# Patient Record
Sex: Female | Born: 1950
Health system: Southern US, Community
[De-identification: ages and names within clinical notes are randomized; demographics above are authoritative.]

## PROBLEM LIST (undated history)

## (undated) DIAGNOSIS — C801 Malignant (primary) neoplasm, unspecified: Secondary | ICD-10-CM

## (undated) DIAGNOSIS — E785 Hyperlipidemia, unspecified: Secondary | ICD-10-CM

## (undated) DIAGNOSIS — M1990 Unspecified osteoarthritis, unspecified site: Secondary | ICD-10-CM

## (undated) DIAGNOSIS — T7840XA Allergy, unspecified, initial encounter: Secondary | ICD-10-CM

## (undated) DIAGNOSIS — M8580 Other specified disorders of bone density and structure, unspecified site: Secondary | ICD-10-CM

## (undated) HISTORY — DX: Other specified disorders of bone density and structure, unspecified site: M85.80

## (undated) HISTORY — DX: Unspecified osteoarthritis, unspecified site: M19.90

## (undated) HISTORY — PX: GASTROSTOMY: SHX151

## (undated) HISTORY — DX: Hyperlipidemia, unspecified: E78.5

## (undated) HISTORY — DX: Allergy, unspecified, initial encounter: T78.40XA

## (undated) HISTORY — DX: Malignant (primary) neoplasm, unspecified: C80.1

---

## 1998-03-30 HISTORY — PX: BREAST BIOPSY: SHX20

## 2003-03-23 HISTORY — PX: MELANOMA EXCISION: SHX5266

## 2004-07-22 ENCOUNTER — Other Ambulatory Visit: Admission: RE | Admit: 2004-07-22 | Discharge: 2004-07-22 | Payer: Self-pay | Admitting: *Deleted

## 2005-01-02 HISTORY — PX: COLONOSCOPY: SHX174

## 2010-07-09 HISTORY — PX: OOPHORECTOMY: SHX86

## 2013-12-14 HISTORY — PX: ABDOMINAL HYSTERECTOMY: SHX81

## 2014-11-17 HISTORY — PX: MOHS SURGERY: SUR867

## 2015-09-15 ENCOUNTER — Other Ambulatory Visit: Payer: Self-pay | Admitting: Family Medicine

## 2015-09-15 DIAGNOSIS — Z1211 Encounter for screening for malignant neoplasm of colon: Secondary | ICD-10-CM

## 2015-09-30 ENCOUNTER — Encounter: Payer: Self-pay | Admitting: Gastroenterology

## 2015-11-08 ENCOUNTER — Ambulatory Visit: Payer: Self-pay

## 2015-11-29 ENCOUNTER — Ambulatory Visit (AMBULATORY_SURGERY_CENTER): Payer: Self-pay | Admitting: *Deleted

## 2015-11-29 VITALS — Ht 63.0 in | Wt 145.0 lb

## 2015-11-29 DIAGNOSIS — Z1211 Encounter for screening for malignant neoplasm of colon: Secondary | ICD-10-CM

## 2015-11-29 MED ORDER — NA SULFATE-K SULFATE-MG SULF 17.5-3.13-1.6 GM/177ML PO SOLN: 1.0000 | Freq: Once | ORAL | 0 refills | Status: AC

## 2015-11-29 NOTE — Progress Notes (Signed)
No egg or soy allergy known to patient  No issues with past sedation with any surgeries  or procedures, no intubation problems  No diet pills per patient No home 02 use per patient  No blood thinners per patient  Pt denies issues with constipation  No A fib or A flutter   

## 2015-12-13 ENCOUNTER — Ambulatory Visit (AMBULATORY_SURGERY_CENTER): Payer: Commercial Managed Care - PPO | Admitting: Gastroenterology

## 2015-12-13 ENCOUNTER — Encounter: Payer: Self-pay | Admitting: Gastroenterology

## 2015-12-13 VITALS — BP 134/71 | HR 78 | Temp 97.7°F | Resp 18 | Ht 63.0 in | Wt 145.0 lb

## 2015-12-13 DIAGNOSIS — D123 Benign neoplasm of transverse colon: Secondary | ICD-10-CM | POA: Diagnosis not present

## 2015-12-13 DIAGNOSIS — Z1212 Encounter for screening for malignant neoplasm of rectum: Secondary | ICD-10-CM | POA: Diagnosis not present

## 2015-12-13 DIAGNOSIS — Z1211 Encounter for screening for malignant neoplasm of colon: Secondary | ICD-10-CM

## 2015-12-13 MED ORDER — SODIUM CHLORIDE 0.9 % IV SOLN: 500.0000 mL | INTRAVENOUS | Status: DC

## 2015-12-13 NOTE — Op Note (Signed)
[place] Patient Name: Brianna Cruz Procedure Date: 12/13/2015 9:01 AM MRN: [id number] Endoscopist: Remo Lipps P. Geary Rufo MD, MD Age: 65 Referring MD:  Date of Birth: 1950-10-25 Gender: Female Account #: [id number] Procedure:                Colonoscopy Indications:              Screening for colorectal malignant neoplasm Medicines:                Monitored Anesthesia Care Procedure:                Pre-Anesthesia Assessment:                           - Prior to the procedure, a History and Physical                            was performed, and patient medications and                            allergies were reviewed. The patient's tolerance of                            previous anesthesia was also reviewed. The risks                            and benefits of the procedure and the sedation                            options and risks were discussed with the patient.                            All questions were answered, and informed consent                            was obtained. Prior Anticoagulants: The patient has                            taken Coumadin (warfarin), last dose was 1 day                            prior to procedure. ASA Grade Assessment: I - A                            normal, healthy patient. After reviewing the risks                            and benefits, the patient was deemed in                            satisfactory condition to undergo the procedure.                           After obtaining informed consent, the colonoscope  was passed under direct vision. Throughout the                            procedure, the patient's blood pressure, pulse, and                            oxygen saturations were monitored continuously. The                            Model PCF-H190DL [id number]) scope was introduced                            through the anus and advanced to the the cecum,                            identified by  appendiceal orifice and ileocecal                            valve. The colonoscopy was performed without                            difficulty. The patient tolerated the procedure                            well. The quality of the bowel preparation was                            good. The ileocecal valve, appendiceal orifice, and                            rectum were photographed. Scope In: 9:11:14 AM Scope Out: 9:28:44 AM Scope Withdrawal Time: 0 hours 14 minutes 54 seconds  Total Procedure Duration: 0 hours 17 minutes 30 seconds  Findings:                 The perianal and digital rectal examinations were                            normal.                           A 5 mm polyp was found in the transverse colon. The                            polyp was sessile. The polyp was removed with a                            cold snare. Resection and retrieval were complete.                           Scattered medium-mouthed diverticula were found in                            the left colon and right colon.  The exam was otherwise without abnormality. Complications:            No immediate complications. Estimated blood loss:                            Minimal. Estimated Blood Loss:     Estimated blood loss was minimal. Impression:               - One 5 mm polyp in the transverse colon, removed                            with a cold snare. Resected and retrieved.                           - Diverticulosis in the left colon and in the right                            colon.                           - The examination was otherwise normal. Recommendation:           - Patient has a contact number available for                            emergencies. The signs and symptoms of potential                            delayed complications were discussed with the                            patient. Return to normal activities tomorrow.                            Written  discharge instructions were provided to the                            patient.                           - Resume previous diet.                           - Continue present medications.                           - No ibuprofen, naproxen, or other non-steroidal                            anti-inflammatory drugs for 2 weeks after polyp                            removal.                           - Await pathology results.                           -  Repeat colonoscopy is recommended for                            surveillance. The colonoscopy date will be                            determined after pathology results from today's                            exam become available for review. Remo Lipps P. [name] MD, MD 12/13/2015 9:32:00 AM This report has been signed electronically.

## 2015-12-13 NOTE — Patient Instructions (Addendum)
NO NSAIDS (MOTRIN, ADVIL, IBUOROFEN, ALEVE, NAPROSYN ETC) FOR TWO WEEKS UNTIL 12/27/15.    YOU HAD AN ENDOSCOPIC PROCEDURE TODAY AT [facility]:   Refer to the procedure report that was given to you for any specific questions about what was found during the examination.  If the procedure report does not answer your questions, please call your gastroenterologist to clarify.  If you requested that your care partner not be given the details of your procedure findings, then the procedure report has been included in a sealed envelope for you to review at your convenience later.  YOU SHOULD EXPECT: Some feelings of bloating in the abdomen. Passage of more gas than usual.  Walking can help get rid of the air that was put into your GI tract during the procedure and reduce the bloating. If you had a lower endoscopy (such as a colonoscopy or flexible sigmoidoscopy) you may notice spotting of blood in your stool or on the toilet paper. If you underwent a bowel prep for your procedure, you may not have a normal bowel movement for a few days.  Please Note:  You might notice some irritation and congestion in your nose or some drainage.  This is from the oxygen used during your procedure.  There is no need for concern and it should clear up in a day or so.  SYMPTOMS TO REPORT IMMEDIATELY:   Following lower endoscopy (colonoscopy or flexible sigmoidoscopy):  Excessive amounts of blood in the stool  Significant tenderness or worsening of abdominal pains  Swelling of the abdomen that is new, acute  Fever of 100F or higher  For urgent or emergent issues, a gastroenterologist can be reached at any hour by calling [phone].   DIET:  We do recommend a small meal at first, but then you may proceed to your regular diet.  Drink plenty of fluids but you should avoid alcoholic beverages for 24 hours.  ACTIVITY:  You should plan to take it easy for the rest of today and you should NOT DRIVE or  use heavy machinery until tomorrow (because of the sedation medicines used during the test).    FOLLOW UP: Our staff will call the number listed on your records the next business day following your procedure to check on you and address any questions or concerns that you may have regarding the information given to you following your procedure. If we do not reach you, we will leave a message.  However, if you are feeling well and you are not experiencing any problems, there is no need to return our call.  We will assume that you have returned to your regular daily activities without incident.  If any biopsies were taken you will be contacted by phone or by letter within the next 1-3 weeks.  Please call us at [phone] if you have not heard about the biopsies in 3 weeks.    SIGNATURES/CONFIDENTIALITY: You and/or your care partner have signed paperwork which will be entered into your electronic medical record.  These signatures attest to the fact that that the information above on your After Visit Summary has been reviewed and is understood.  Full responsibility of the confidentiality of this discharge information lies with you and/or your care-partner.

## 2015-12-13 NOTE — Progress Notes (Signed)
PATIENT COMPLAINED OF LOWER ABDOMINAL DISCOMFORT, 4/10. PATIENT LAYED DOWN AND PASSED GAS, 1/10. PATIENT SEEN AGAIN BY DR. Wilma Flavin, OKED FOR DISCHARGE.

## 2015-12-13 NOTE — Progress Notes (Signed)
Report given to PACU RN, vss 

## 2015-12-13 NOTE — Progress Notes (Signed)
Called to room to assist during endoscopic procedure.  Patient ID and intended procedure confirmed with present staff. Received instructions for my participation in the procedure from the performing physician.  

## 2015-12-14 ENCOUNTER — Telehealth: Payer: Self-pay

## 2015-12-14 NOTE — Telephone Encounter (Signed)
  Follow up Call-  Call back number 12/13/2015  Post procedure Call Back phone  # [phone]  Permission to leave phone message Yes  Some recent data might be hidden     Patient questions:  Do you have a fever, pain , or abdominal swelling? No. Pain Score  0 *  Have you tolerated food without any problems? Yes.    Have you been able to return to your normal activities? Yes.    Do you have any questions about your discharge instructions: Diet   No. Medications  No. Follow up visit  No.  Do you have questions or concerns about your Care? No.  Actions: * If pain score is 4 or above: No action needed, pain <4.

## 2015-12-14 NOTE — Telephone Encounter (Signed)
  Follow up Call-  Call back number 12/13/2015  Post procedure Call Back phone  # [phone]  Permission to leave phone message Yes  Some recent data might be hidden     Patient was called for follow up after her procedure on 12/13/2015. No answer at the number given for follow up phone call. A message was left on the answering machine.

## 2015-12-17 ENCOUNTER — Telehealth: Payer: Self-pay | Admitting: Gastroenterology

## 2015-12-17 NOTE — Telephone Encounter (Signed)
Patient normally takes an 81 mg tablet of aspirin daily. She wondered if this was okay to continue as her discharge instructions said to stay away from NSAIDs for two weeks. She states she has no specific reason for taking the aspirin, has not noticed any blood in her stool. Told her that it was okay to continue if she wanted to but to avoid ibuprofen, goody powders, aleve etc. for the next two weeks. She will call office if she has questions or concerns.

## 2015-12-20 ENCOUNTER — Encounter: Payer: Self-pay | Admitting: Gastroenterology

## 2017-08-22 DIAGNOSIS — M722 Plantar fascial fibromatosis: Secondary | ICD-10-CM | POA: Diagnosis not present

## 2017-08-22 DIAGNOSIS — M7662 Achilles tendinitis, left leg: Secondary | ICD-10-CM | POA: Diagnosis not present

## 2017-08-22 DIAGNOSIS — M79671 Pain in right foot: Secondary | ICD-10-CM | POA: Diagnosis not present

## 2017-08-22 DIAGNOSIS — M79672 Pain in left foot: Secondary | ICD-10-CM | POA: Diagnosis not present

## 2017-09-04 DIAGNOSIS — Q667 Congenital pes cavus: Secondary | ICD-10-CM | POA: Diagnosis not present

## 2017-09-04 DIAGNOSIS — M79672 Pain in left foot: Secondary | ICD-10-CM | POA: Diagnosis not present

## 2017-09-04 DIAGNOSIS — M79671 Pain in right foot: Secondary | ICD-10-CM | POA: Diagnosis not present

## 2017-09-04 DIAGNOSIS — M722 Plantar fascial fibromatosis: Secondary | ICD-10-CM | POA: Diagnosis not present

## 2017-10-04 DIAGNOSIS — M722 Plantar fascial fibromatosis: Secondary | ICD-10-CM | POA: Diagnosis not present

## 2017-10-04 DIAGNOSIS — M79672 Pain in left foot: Secondary | ICD-10-CM | POA: Diagnosis not present

## 2017-10-04 DIAGNOSIS — M79671 Pain in right foot: Secondary | ICD-10-CM | POA: Diagnosis not present

## 2017-10-25 DIAGNOSIS — R69 Illness, unspecified: Secondary | ICD-10-CM | POA: Diagnosis not present

## 2017-11-03 DIAGNOSIS — R69 Illness, unspecified: Secondary | ICD-10-CM | POA: Diagnosis not present

## 2017-11-23 DIAGNOSIS — J209 Acute bronchitis, unspecified: Secondary | ICD-10-CM | POA: Diagnosis not present

## 2017-11-26 DIAGNOSIS — Z85820 Personal history of malignant melanoma of skin: Secondary | ICD-10-CM | POA: Diagnosis not present

## 2017-11-26 DIAGNOSIS — Z85828 Personal history of other malignant neoplasm of skin: Secondary | ICD-10-CM | POA: Diagnosis not present

## 2017-11-26 DIAGNOSIS — L821 Other seborrheic keratosis: Secondary | ICD-10-CM | POA: Diagnosis not present

## 2017-11-26 DIAGNOSIS — Z09 Encounter for follow-up examination after completed treatment for conditions other than malignant neoplasm: Secondary | ICD-10-CM | POA: Diagnosis not present

## 2017-11-26 DIAGNOSIS — D485 Neoplasm of uncertain behavior of skin: Secondary | ICD-10-CM | POA: Diagnosis not present

## 2017-11-26 DIAGNOSIS — L738 Other specified follicular disorders: Secondary | ICD-10-CM | POA: Diagnosis not present

## 2017-11-26 DIAGNOSIS — D225 Melanocytic nevi of trunk: Secondary | ICD-10-CM | POA: Diagnosis not present

## 2017-11-26 DIAGNOSIS — Z872 Personal history of diseases of the skin and subcutaneous tissue: Secondary | ICD-10-CM | POA: Diagnosis not present

## 2017-11-26 DIAGNOSIS — Z08 Encounter for follow-up examination after completed treatment for malignant neoplasm: Secondary | ICD-10-CM | POA: Diagnosis not present

## 2017-11-26 DIAGNOSIS — D2271 Melanocytic nevi of right lower limb, including hip: Secondary | ICD-10-CM | POA: Diagnosis not present

## 2017-12-06 DIAGNOSIS — R69 Illness, unspecified: Secondary | ICD-10-CM | POA: Diagnosis not present

## 2018-02-08 DIAGNOSIS — R5383 Other fatigue: Secondary | ICD-10-CM | POA: Diagnosis not present

## 2018-02-08 DIAGNOSIS — Z136 Encounter for screening for cardiovascular disorders: Secondary | ICD-10-CM | POA: Diagnosis not present

## 2018-02-08 DIAGNOSIS — R350 Frequency of micturition: Secondary | ICD-10-CM | POA: Diagnosis not present

## 2018-02-08 DIAGNOSIS — Z0000 Encounter for general adult medical examination without abnormal findings: Secondary | ICD-10-CM | POA: Diagnosis not present

## 2018-02-19 ENCOUNTER — Other Ambulatory Visit: Payer: Self-pay | Admitting: Physician Assistant

## 2018-02-19 DIAGNOSIS — Z1231 Encounter for screening mammogram for malignant neoplasm of breast: Secondary | ICD-10-CM

## 2018-03-15 ENCOUNTER — Other Ambulatory Visit: Payer: Self-pay

## 2018-03-15 ENCOUNTER — Ambulatory Visit
Admission: RE | Admit: 2018-03-15 | Discharge: 2018-03-15 | Disposition: A | Discharge status: Home / Self Care | Payer: Medicare HMO | Source: Ambulatory Visit | Attending: Physician Assistant | Admitting: Physician Assistant

## 2018-03-15 DIAGNOSIS — Z1231 Encounter for screening mammogram for malignant neoplasm of breast: Secondary | ICD-10-CM | POA: Diagnosis not present

## 2018-05-24 DIAGNOSIS — H5203 Hypermetropia, bilateral: Secondary | ICD-10-CM | POA: Diagnosis not present

## 2018-07-15 DIAGNOSIS — Z0100 Encounter for examination of eyes and vision without abnormal findings: Secondary | ICD-10-CM | POA: Diagnosis not present

## 2018-10-14 DIAGNOSIS — H11221 Conjunctival granuloma, right eye: Secondary | ICD-10-CM | POA: Diagnosis not present

## 2018-10-14 DIAGNOSIS — Z23 Encounter for immunization: Secondary | ICD-10-CM | POA: Diagnosis not present

## 2018-11-04 DIAGNOSIS — H11221 Conjunctival granuloma, right eye: Secondary | ICD-10-CM | POA: Diagnosis not present

## 2018-12-03 DIAGNOSIS — R69 Illness, unspecified: Secondary | ICD-10-CM | POA: Diagnosis not present

## 2019-01-17 DIAGNOSIS — N644 Mastodynia: Secondary | ICD-10-CM | POA: Diagnosis not present

## 2019-01-22 ENCOUNTER — Other Ambulatory Visit: Payer: Self-pay | Admitting: Family Medicine

## 2019-01-22 DIAGNOSIS — N644 Mastodynia: Secondary | ICD-10-CM

## 2019-02-04 ENCOUNTER — Ambulatory Visit
Admission: RE | Admit: 2019-02-04 | Discharge: 2019-02-04 | Disposition: A | Discharge status: Home / Self Care | Payer: Medicare HMO | Source: Ambulatory Visit | Attending: Family Medicine | Admitting: Family Medicine

## 2019-02-04 ENCOUNTER — Other Ambulatory Visit: Payer: Self-pay | Admitting: Family Medicine

## 2019-02-04 ENCOUNTER — Other Ambulatory Visit: Payer: Self-pay

## 2019-02-04 DIAGNOSIS — N644 Mastodynia: Secondary | ICD-10-CM

## 2019-02-04 DIAGNOSIS — R922 Inconclusive mammogram: Secondary | ICD-10-CM | POA: Diagnosis not present

## 2019-02-04 DIAGNOSIS — N6310 Unspecified lump in the right breast, unspecified quadrant: Secondary | ICD-10-CM

## 2019-02-04 DIAGNOSIS — N6312 Unspecified lump in the right breast, upper inner quadrant: Secondary | ICD-10-CM | POA: Diagnosis not present

## 2019-02-09 ENCOUNTER — Ambulatory Visit: Payer: Medicare HMO | Attending: Internal Medicine

## 2019-02-09 DIAGNOSIS — Z23 Encounter for immunization: Secondary | ICD-10-CM | POA: Insufficient documentation

## 2019-02-09 NOTE — Progress Notes (Signed)
   Covid-19 Vaccination Clinic  Name:  Brianna Cruz    MRN: [id number] DOB: June 29, 1950  02/09/2019  [name] was observed post Covid-19 immunization for 15 minutes without incidence. She was provided with Vaccine Information Sheet and instruction to access the V-Safe system.   [name] was instructed to call 911 with any severe reactions post vaccine: Marland Kitchen Difficulty breathing  . Swelling of your face and throat  . A fast heartbeat  . A bad rash all over your body  . Dizziness and weakness    Immunizations Administered    Name Date Dose VIS Date Route   Pfizer COVID-19 Vaccine 02/09/2019  4:33 PM 0.3 mL 12/13/2018 Intramuscular   Manufacturer: [place]   Lot: [id number]   [place]: [id number]

## 2019-02-13 DIAGNOSIS — Z0000 Encounter for general adult medical examination without abnormal findings: Secondary | ICD-10-CM | POA: Diagnosis not present

## 2019-02-13 DIAGNOSIS — E785 Hyperlipidemia, unspecified: Secondary | ICD-10-CM | POA: Diagnosis not present

## 2019-02-28 DIAGNOSIS — Z85820 Personal history of malignant melanoma of skin: Secondary | ICD-10-CM | POA: Diagnosis not present

## 2019-02-28 DIAGNOSIS — D2261 Melanocytic nevi of right upper limb, including shoulder: Secondary | ICD-10-CM | POA: Diagnosis not present

## 2019-02-28 DIAGNOSIS — D2262 Melanocytic nevi of left upper limb, including shoulder: Secondary | ICD-10-CM | POA: Diagnosis not present

## 2019-02-28 DIAGNOSIS — Z85828 Personal history of other malignant neoplasm of skin: Secondary | ICD-10-CM | POA: Diagnosis not present

## 2019-02-28 DIAGNOSIS — D0359 Melanoma in situ of other part of trunk: Secondary | ICD-10-CM | POA: Diagnosis not present

## 2019-02-28 DIAGNOSIS — L821 Other seborrheic keratosis: Secondary | ICD-10-CM | POA: Diagnosis not present

## 2019-02-28 DIAGNOSIS — L814 Other melanin hyperpigmentation: Secondary | ICD-10-CM | POA: Diagnosis not present

## 2019-03-06 ENCOUNTER — Ambulatory Visit: Payer: Medicare HMO | Attending: Internal Medicine

## 2019-03-06 DIAGNOSIS — Z23 Encounter for immunization: Secondary | ICD-10-CM | POA: Insufficient documentation

## 2019-03-06 NOTE — Progress Notes (Signed)
   Covid-19 Vaccination Clinic  Name:  Brianna Cruz    MRN: [id number] DOB: 23-Dec-1950  03/06/2019  [name] was observed post Covid-19 immunization for 15 minutes without incident. She was provided with Vaccine Information Sheet and instruction to access the V-Safe system.   [name] was instructed to call 911 with any severe reactions post vaccine: Marland Kitchen Difficulty breathing  . Swelling of face and throat  . A fast heartbeat  . A bad rash all over body  . Dizziness and weakness   Immunizations Administered    Name Date Dose VIS Date Route   Pfizer COVID-19 Vaccine 03/06/2019 12:50 AM 0.3 mL 12/13/2018 Intramuscular   Manufacturer: [place]   Lot: [id number]   [place]: [id number]

## 2019-03-20 DIAGNOSIS — D487 Neoplasm of uncertain behavior of other specified sites: Secondary | ICD-10-CM | POA: Diagnosis not present

## 2019-03-20 DIAGNOSIS — D0359 Melanoma in situ of other part of trunk: Secondary | ICD-10-CM | POA: Diagnosis not present

## 2019-03-20 DIAGNOSIS — Z85828 Personal history of other malignant neoplasm of skin: Secondary | ICD-10-CM | POA: Diagnosis not present

## 2019-03-20 DIAGNOSIS — Z85820 Personal history of malignant melanoma of skin: Secondary | ICD-10-CM | POA: Diagnosis not present

## 2019-04-01 ENCOUNTER — Ambulatory Visit: Payer: Medicare HMO

## 2019-04-01 ENCOUNTER — Other Ambulatory Visit: Payer: Self-pay | Admitting: Family Medicine

## 2019-04-01 ENCOUNTER — Other Ambulatory Visit: Payer: Self-pay

## 2019-04-01 ENCOUNTER — Ambulatory Visit
Admission: RE | Admit: 2019-04-01 | Discharge: 2019-04-01 | Disposition: A | Discharge status: Home / Self Care | Payer: Medicare HMO | Source: Ambulatory Visit | Attending: Family Medicine | Admitting: Family Medicine

## 2019-04-01 DIAGNOSIS — R922 Inconclusive mammogram: Secondary | ICD-10-CM | POA: Diagnosis not present

## 2019-04-01 DIAGNOSIS — N6310 Unspecified lump in the right breast, unspecified quadrant: Secondary | ICD-10-CM

## 2019-08-20 DIAGNOSIS — H5203 Hypermetropia, bilateral: Secondary | ICD-10-CM | POA: Diagnosis not present

## 2019-08-25 DIAGNOSIS — Z0100 Encounter for examination of eyes and vision without abnormal findings: Secondary | ICD-10-CM | POA: Diagnosis not present

## 2019-08-28 DIAGNOSIS — L738 Other specified follicular disorders: Secondary | ICD-10-CM | POA: Diagnosis not present

## 2019-08-28 DIAGNOSIS — Z85828 Personal history of other malignant neoplasm of skin: Secondary | ICD-10-CM | POA: Diagnosis not present

## 2019-08-28 DIAGNOSIS — L820 Inflamed seborrheic keratosis: Secondary | ICD-10-CM | POA: Diagnosis not present

## 2019-08-28 DIAGNOSIS — Z85820 Personal history of malignant melanoma of skin: Secondary | ICD-10-CM | POA: Diagnosis not present

## 2019-08-28 DIAGNOSIS — D2271 Melanocytic nevi of right lower limb, including hip: Secondary | ICD-10-CM | POA: Diagnosis not present

## 2019-09-09 DIAGNOSIS — R69 Illness, unspecified: Secondary | ICD-10-CM | POA: Diagnosis not present

## 2019-10-06 ENCOUNTER — Ambulatory Visit
Admission: RE | Admit: 2019-10-06 | Discharge: 2019-10-06 | Disposition: A | Discharge status: Home / Self Care | Payer: Medicare HMO | Source: Ambulatory Visit | Attending: Family Medicine | Admitting: Family Medicine

## 2019-10-06 ENCOUNTER — Other Ambulatory Visit: Payer: Self-pay

## 2019-10-06 DIAGNOSIS — N6310 Unspecified lump in the right breast, unspecified quadrant: Secondary | ICD-10-CM

## 2019-10-06 DIAGNOSIS — N6312 Unspecified lump in the right breast, upper inner quadrant: Secondary | ICD-10-CM | POA: Diagnosis not present

## 2019-10-06 DIAGNOSIS — R922 Inconclusive mammogram: Secondary | ICD-10-CM | POA: Diagnosis not present

## 2019-10-14 ENCOUNTER — Other Ambulatory Visit: Payer: Self-pay | Admitting: Family Medicine

## 2019-10-14 DIAGNOSIS — Z1231 Encounter for screening mammogram for malignant neoplasm of breast: Secondary | ICD-10-CM

## 2019-10-24 DIAGNOSIS — Z23 Encounter for immunization: Secondary | ICD-10-CM | POA: Diagnosis not present

## 2019-10-31 DIAGNOSIS — R21 Rash and other nonspecific skin eruption: Secondary | ICD-10-CM | POA: Diagnosis not present

## 2019-11-01 ENCOUNTER — Ambulatory Visit: Payer: Medicare HMO

## 2019-11-21 ENCOUNTER — Ambulatory Visit: Payer: Medicare HMO | Attending: Internal Medicine

## 2019-11-21 DIAGNOSIS — Z23 Encounter for immunization: Secondary | ICD-10-CM

## 2019-11-21 NOTE — Progress Notes (Signed)
   Covid-19 Vaccination Clinic  Name:  Brianna Cruz    MRN: [id number] DOB: 04/16/1950  11/21/2019  Brianna Cruz was observed post Covid-19 immunization for 15 minutes without incident. She was provided with Vaccine Information Sheet and instruction to access the V-Safe system.   Brianna Cruz was instructed to call 911 with any severe reactions post vaccine: Marland Kitchen Difficulty breathing  . Swelling of face and throat  . A fast heartbeat  . A bad rash all over body  . Dizziness and weakness   Immunizations Administered    Name Date Dose VIS Date Route   Pfizer COVID-19 Vaccine 11/21/2019  1:16 PM 0.3 mL 10/22/2019 Intramuscular   Manufacturer: [place]   Lot: [id number]   [place]: [id number]

## 2019-12-01 DIAGNOSIS — L821 Other seborrheic keratosis: Secondary | ICD-10-CM | POA: Diagnosis not present

## 2019-12-01 DIAGNOSIS — L738 Other specified follicular disorders: Secondary | ICD-10-CM | POA: Diagnosis not present

## 2019-12-01 DIAGNOSIS — Z85820 Personal history of malignant melanoma of skin: Secondary | ICD-10-CM | POA: Diagnosis not present

## 2019-12-01 DIAGNOSIS — Z85828 Personal history of other malignant neoplasm of skin: Secondary | ICD-10-CM | POA: Diagnosis not present

## 2019-12-01 DIAGNOSIS — D2261 Melanocytic nevi of right upper limb, including shoulder: Secondary | ICD-10-CM | POA: Diagnosis not present

## 2020-02-16 ENCOUNTER — Other Ambulatory Visit: Payer: Self-pay | Admitting: Physician Assistant

## 2020-02-16 DIAGNOSIS — E785 Hyperlipidemia, unspecified: Secondary | ICD-10-CM | POA: Diagnosis not present

## 2020-02-16 DIAGNOSIS — Z780 Asymptomatic menopausal state: Secondary | ICD-10-CM

## 2020-02-16 DIAGNOSIS — R1031 Right lower quadrant pain: Secondary | ICD-10-CM | POA: Diagnosis not present

## 2020-02-16 DIAGNOSIS — Z23 Encounter for immunization: Secondary | ICD-10-CM | POA: Diagnosis not present

## 2020-02-16 DIAGNOSIS — Z0000 Encounter for general adult medical examination without abnormal findings: Secondary | ICD-10-CM | POA: Diagnosis not present

## 2020-03-02 DIAGNOSIS — D2271 Melanocytic nevi of right lower limb, including hip: Secondary | ICD-10-CM | POA: Diagnosis not present

## 2020-03-02 DIAGNOSIS — L821 Other seborrheic keratosis: Secondary | ICD-10-CM | POA: Diagnosis not present

## 2020-03-02 DIAGNOSIS — D225 Melanocytic nevi of trunk: Secondary | ICD-10-CM | POA: Diagnosis not present

## 2020-03-02 DIAGNOSIS — D1801 Hemangioma of skin and subcutaneous tissue: Secondary | ICD-10-CM | POA: Diagnosis not present

## 2020-03-02 DIAGNOSIS — D485 Neoplasm of uncertain behavior of skin: Secondary | ICD-10-CM | POA: Diagnosis not present

## 2020-03-02 DIAGNOSIS — Z85828 Personal history of other malignant neoplasm of skin: Secondary | ICD-10-CM | POA: Diagnosis not present

## 2020-03-02 DIAGNOSIS — Z85820 Personal history of malignant melanoma of skin: Secondary | ICD-10-CM | POA: Diagnosis not present

## 2020-03-02 DIAGNOSIS — D2239 Melanocytic nevi of other parts of face: Secondary | ICD-10-CM | POA: Diagnosis not present

## 2020-04-09 ENCOUNTER — Inpatient Hospital Stay: Admission: RE | Admit: 2020-04-09 | Payer: Medicare HMO | Source: Ambulatory Visit

## 2020-08-17 ENCOUNTER — Ambulatory Visit
Admission: RE | Admit: 2020-08-17 | Discharge: 2020-08-17 | Disposition: A | Discharge status: Home / Self Care | Payer: Medicare HMO | Source: Ambulatory Visit | Attending: Family Medicine | Admitting: Family Medicine

## 2020-08-17 ENCOUNTER — Other Ambulatory Visit: Payer: Self-pay

## 2020-08-17 DIAGNOSIS — Z1231 Encounter for screening mammogram for malignant neoplasm of breast: Secondary | ICD-10-CM

## 2020-08-23 DIAGNOSIS — H5203 Hypermetropia, bilateral: Secondary | ICD-10-CM | POA: Diagnosis not present

## 2020-08-30 DIAGNOSIS — Z0100 Encounter for examination of eyes and vision without abnormal findings: Secondary | ICD-10-CM | POA: Diagnosis not present

## 2020-09-02 DIAGNOSIS — L738 Other specified follicular disorders: Secondary | ICD-10-CM | POA: Diagnosis not present

## 2020-09-02 DIAGNOSIS — L821 Other seborrheic keratosis: Secondary | ICD-10-CM | POA: Diagnosis not present

## 2020-09-02 DIAGNOSIS — Z85820 Personal history of malignant melanoma of skin: Secondary | ICD-10-CM | POA: Diagnosis not present

## 2020-09-02 DIAGNOSIS — D224 Melanocytic nevi of scalp and neck: Secondary | ICD-10-CM | POA: Diagnosis not present

## 2020-09-02 DIAGNOSIS — Z85828 Personal history of other malignant neoplasm of skin: Secondary | ICD-10-CM | POA: Diagnosis not present

## 2020-09-02 DIAGNOSIS — D2271 Melanocytic nevi of right lower limb, including hip: Secondary | ICD-10-CM | POA: Diagnosis not present

## 2020-09-08 ENCOUNTER — Other Ambulatory Visit: Payer: Self-pay

## 2020-09-08 ENCOUNTER — Ambulatory Visit
Admission: RE | Admit: 2020-09-08 | Discharge: 2020-09-08 | Disposition: A | Discharge status: Home / Self Care | Payer: Medicare HMO | Source: Ambulatory Visit | Attending: Physician Assistant | Admitting: Physician Assistant

## 2020-09-08 DIAGNOSIS — M8589 Other specified disorders of bone density and structure, multiple sites: Secondary | ICD-10-CM | POA: Diagnosis not present

## 2020-09-08 DIAGNOSIS — Z780 Asymptomatic menopausal state: Secondary | ICD-10-CM

## 2020-10-21 DIAGNOSIS — Z23 Encounter for immunization: Secondary | ICD-10-CM | POA: Diagnosis not present

## 2020-12-02 DIAGNOSIS — L821 Other seborrheic keratosis: Secondary | ICD-10-CM | POA: Diagnosis not present

## 2020-12-02 DIAGNOSIS — L570 Actinic keratosis: Secondary | ICD-10-CM | POA: Diagnosis not present

## 2020-12-02 DIAGNOSIS — L738 Other specified follicular disorders: Secondary | ICD-10-CM | POA: Diagnosis not present

## 2020-12-02 DIAGNOSIS — Z85820 Personal history of malignant melanoma of skin: Secondary | ICD-10-CM | POA: Diagnosis not present

## 2020-12-02 DIAGNOSIS — Z85828 Personal history of other malignant neoplasm of skin: Secondary | ICD-10-CM | POA: Diagnosis not present

## 2020-12-30 ENCOUNTER — Encounter: Payer: Self-pay | Admitting: Gastroenterology

## 2021-03-07 DIAGNOSIS — L570 Actinic keratosis: Secondary | ICD-10-CM | POA: Diagnosis not present

## 2021-03-07 DIAGNOSIS — Z85828 Personal history of other malignant neoplasm of skin: Secondary | ICD-10-CM | POA: Diagnosis not present

## 2021-03-07 DIAGNOSIS — L821 Other seborrheic keratosis: Secondary | ICD-10-CM | POA: Diagnosis not present

## 2021-03-07 DIAGNOSIS — D2272 Melanocytic nevi of left lower limb, including hip: Secondary | ICD-10-CM | POA: Diagnosis not present

## 2021-03-07 DIAGNOSIS — D2271 Melanocytic nevi of right lower limb, including hip: Secondary | ICD-10-CM | POA: Diagnosis not present

## 2021-03-07 DIAGNOSIS — D1801 Hemangioma of skin and subcutaneous tissue: Secondary | ICD-10-CM | POA: Diagnosis not present

## 2021-03-07 DIAGNOSIS — Z85820 Personal history of malignant melanoma of skin: Secondary | ICD-10-CM | POA: Diagnosis not present

## 2021-03-07 DIAGNOSIS — D2361 Other benign neoplasm of skin of right upper limb, including shoulder: Secondary | ICD-10-CM | POA: Diagnosis not present

## 2021-03-16 DIAGNOSIS — E785 Hyperlipidemia, unspecified: Secondary | ICD-10-CM | POA: Diagnosis not present

## 2021-03-16 DIAGNOSIS — Z0000 Encounter for general adult medical examination without abnormal findings: Secondary | ICD-10-CM | POA: Diagnosis not present

## 2021-04-02 IMAGING — US US BREAST*R* LIMITED INC AXILLA
1 series · 5 of 5 positions shown · non-contrast
Comparison: Previous exam(s).

CLINICAL DATA: Patient presents for pain along the lateral aspect
of the right breast.

EXAM:
DIGITAL DIAGNOSTIC RIGHT MAMMOGRAM WITH CAD AND TOMO
ULTRASOUND RIGHT BREAST

[Series 1: us breast*right* limited inc axilla · 0.06mm/px · 5 of 5 slices shown]
[im 1/5]
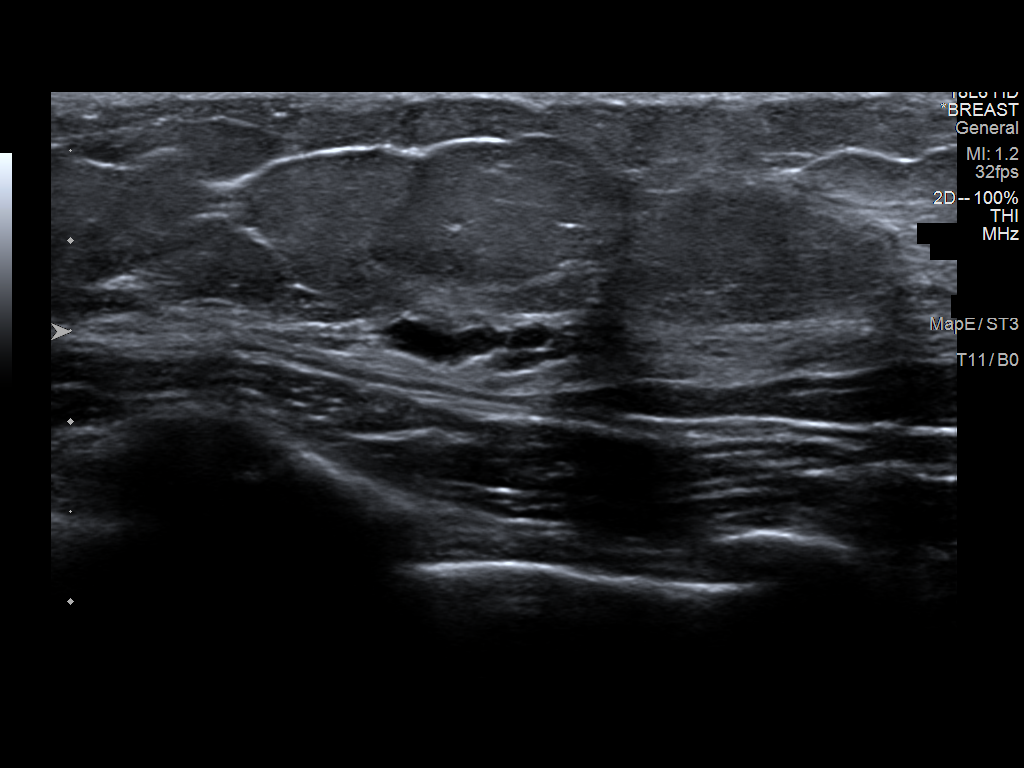
[im 2/5]
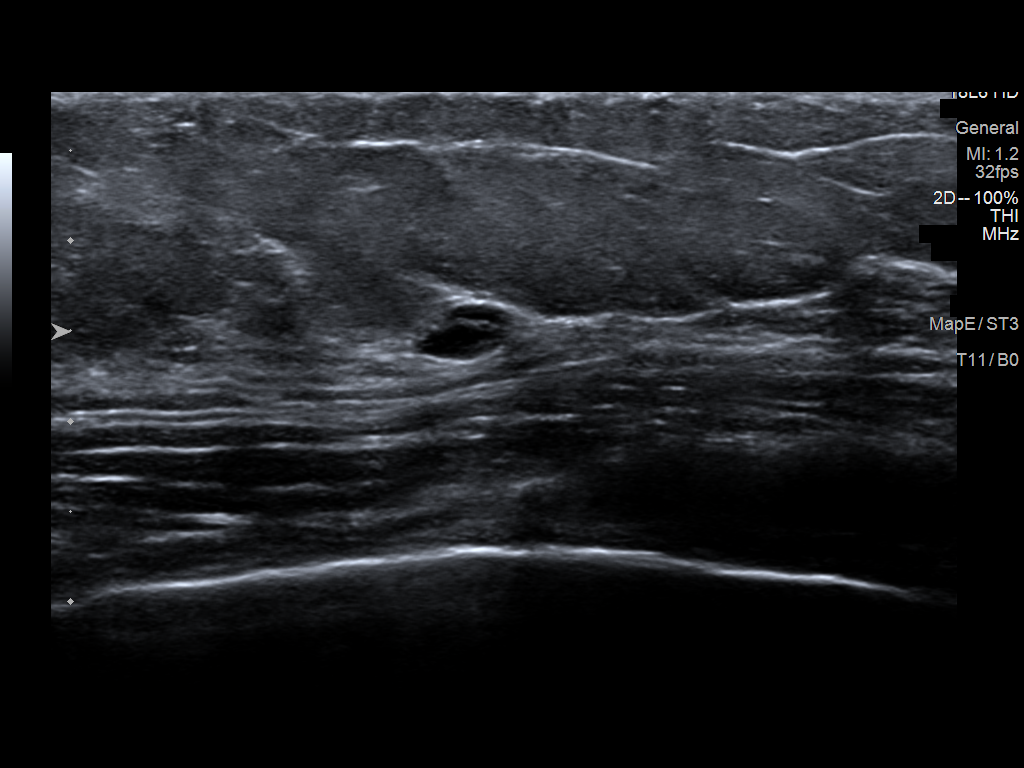
[im 3/5]
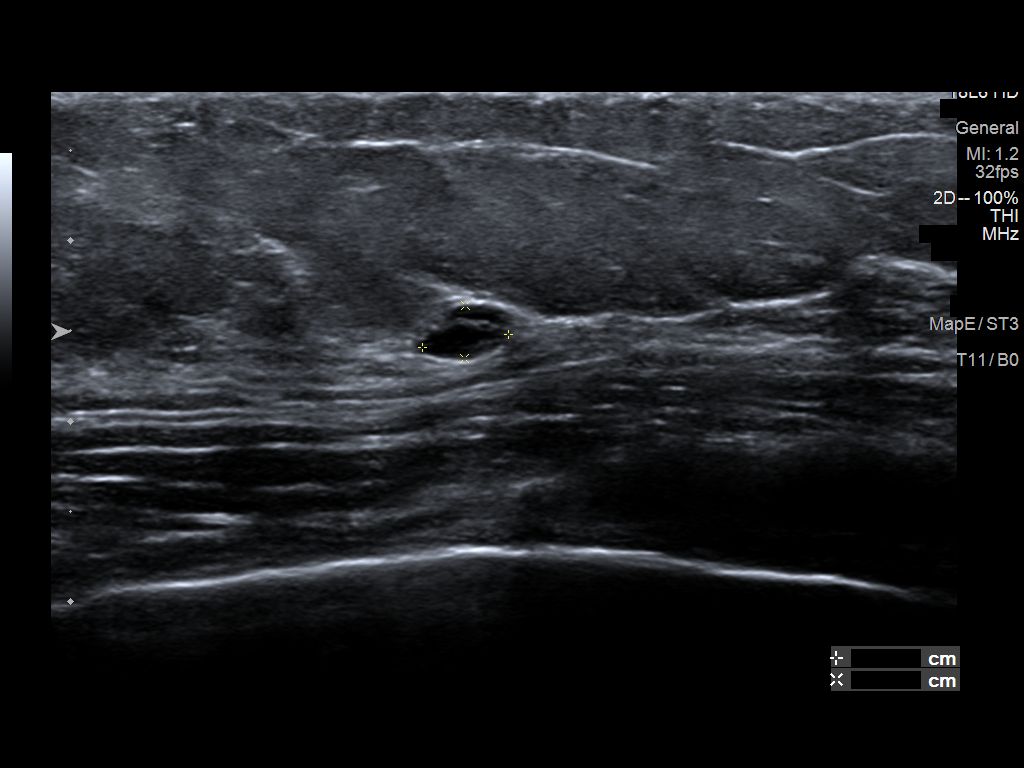
[im 4/5]
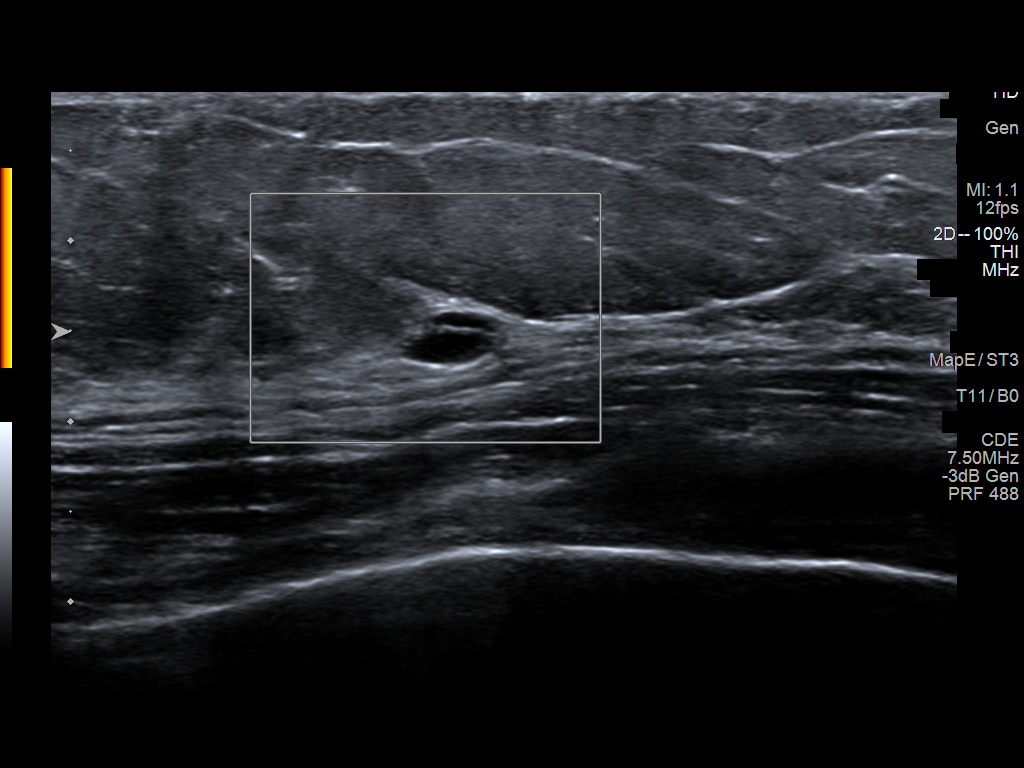
[im 5/5]
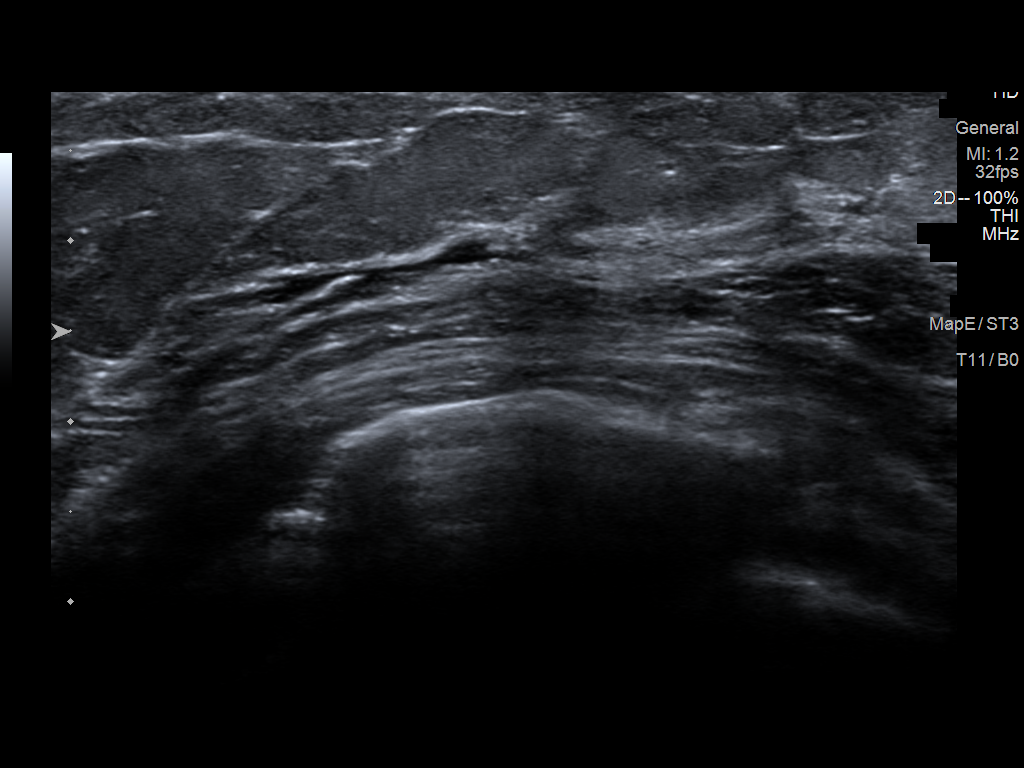

[5 of 5 positions shown; findings below may reference images not displayed]

ACR Breast Density Category c: The breast tissue is heterogeneously
dense, which may obscure small masses.
FINDINGS: There is an oval mass within the upper inner right breast posterior
depth further evaluated with spot compression and full paddle true
lateral tomosynthesis images of the right breast. No additional
masses, calcifications or distortion identified within the right
breast.

Mammographic images were processed with CAD.

On physical exam, no discrete mass is palpated within the outer
right breast or upper inner right breast.

Targeted ultrasound is performed, showing no suspicious abnormality
within the right breast 9 o'clock position 4 cm from the nipple at
the site of tenderness.

Within the right breast 1 o'clock position 3 cm from the nipple
there is a 1.1 x 0.5 x 0.3 cm probable cluster of cysts, felt to
correspond with mammographic abnormality.
IMPRESSION: 1. No suspicious abnormality at the site of tenderness within the
outer right breast.
2. Probably benign right breast mass 1 o'clock position favored to
represent a cluster of cysts, felt to correspond with mammographic
abnormality.

RECOMMENDATION:
Bilateral diagnostic mammography and right breast ultrasound [DATE]
as patient is due for screening of the left breast and for start of
follow-up evaluation of the probably benign right breast mass.

I have discussed the findings and recommendations with the patient.
If applicable, a reminder letter will be sent to the patient
regarding the next appointment.

BI-RADS CATEGORY  3: Probably benign.

## 2021-05-10 DIAGNOSIS — L821 Other seborrheic keratosis: Secondary | ICD-10-CM | POA: Diagnosis not present

## 2021-05-10 DIAGNOSIS — Z85820 Personal history of malignant melanoma of skin: Secondary | ICD-10-CM | POA: Diagnosis not present

## 2021-05-10 DIAGNOSIS — L578 Other skin changes due to chronic exposure to nonionizing radiation: Secondary | ICD-10-CM | POA: Diagnosis not present

## 2021-05-10 DIAGNOSIS — L570 Actinic keratosis: Secondary | ICD-10-CM | POA: Diagnosis not present

## 2021-05-10 DIAGNOSIS — Z85828 Personal history of other malignant neoplasm of skin: Secondary | ICD-10-CM | POA: Diagnosis not present

## 2021-07-06 ENCOUNTER — Other Ambulatory Visit: Payer: Self-pay | Admitting: *Deleted

## 2021-08-23 ENCOUNTER — Other Ambulatory Visit: Payer: Self-pay | Admitting: Physician Assistant

## 2021-08-23 DIAGNOSIS — Z1231 Encounter for screening mammogram for malignant neoplasm of breast: Secondary | ICD-10-CM

## 2021-08-24 ENCOUNTER — Ambulatory Visit
Admission: RE | Admit: 2021-08-24 | Discharge: 2021-08-24 | Disposition: A | Discharge status: Home / Self Care | Payer: Medicare HMO | Source: Ambulatory Visit | Attending: Physician Assistant | Admitting: Physician Assistant

## 2021-08-24 DIAGNOSIS — Z1231 Encounter for screening mammogram for malignant neoplasm of breast: Secondary | ICD-10-CM | POA: Diagnosis not present

## 2021-08-26 ENCOUNTER — Other Ambulatory Visit: Payer: Self-pay | Admitting: Physician Assistant

## 2021-08-26 DIAGNOSIS — R928 Other abnormal and inconclusive findings on diagnostic imaging of breast: Secondary | ICD-10-CM

## 2021-09-08 ENCOUNTER — Ambulatory Visit: Payer: Medicare HMO

## 2021-09-08 ENCOUNTER — Ambulatory Visit
Admission: RE | Admit: 2021-09-08 | Discharge: 2021-09-08 | Disposition: A | Discharge status: Home / Self Care | Payer: Medicare HMO | Source: Ambulatory Visit | Attending: Physician Assistant | Admitting: Physician Assistant

## 2021-09-08 DIAGNOSIS — R928 Other abnormal and inconclusive findings on diagnostic imaging of breast: Secondary | ICD-10-CM

## 2021-09-08 DIAGNOSIS — N6489 Other specified disorders of breast: Secondary | ICD-10-CM | POA: Diagnosis not present

## 2021-09-09 DIAGNOSIS — L821 Other seborrheic keratosis: Secondary | ICD-10-CM | POA: Diagnosis not present

## 2021-09-09 DIAGNOSIS — D1801 Hemangioma of skin and subcutaneous tissue: Secondary | ICD-10-CM | POA: Diagnosis not present

## 2021-09-09 DIAGNOSIS — D2272 Melanocytic nevi of left lower limb, including hip: Secondary | ICD-10-CM | POA: Diagnosis not present

## 2021-09-09 DIAGNOSIS — D2261 Melanocytic nevi of right upper limb, including shoulder: Secondary | ICD-10-CM | POA: Diagnosis not present

## 2021-09-09 DIAGNOSIS — D225 Melanocytic nevi of trunk: Secondary | ICD-10-CM | POA: Diagnosis not present

## 2021-09-09 DIAGNOSIS — D2262 Melanocytic nevi of left upper limb, including shoulder: Secondary | ICD-10-CM | POA: Diagnosis not present

## 2021-09-09 DIAGNOSIS — D2271 Melanocytic nevi of right lower limb, including hip: Secondary | ICD-10-CM | POA: Diagnosis not present

## 2021-09-09 DIAGNOSIS — L820 Inflamed seborrheic keratosis: Secondary | ICD-10-CM | POA: Diagnosis not present

## 2021-09-09 DIAGNOSIS — Z85820 Personal history of malignant melanoma of skin: Secondary | ICD-10-CM | POA: Diagnosis not present

## 2021-09-09 DIAGNOSIS — L738 Other specified follicular disorders: Secondary | ICD-10-CM | POA: Diagnosis not present

## 2021-09-09 DIAGNOSIS — Z85828 Personal history of other malignant neoplasm of skin: Secondary | ICD-10-CM | POA: Diagnosis not present

## 2021-10-13 DIAGNOSIS — Z23 Encounter for immunization: Secondary | ICD-10-CM | POA: Diagnosis not present

## 2021-10-13 DIAGNOSIS — H5203 Hypermetropia, bilateral: Secondary | ICD-10-CM | POA: Diagnosis not present

## 2021-11-10 DIAGNOSIS — Z0100 Encounter for examination of eyes and vision without abnormal findings: Secondary | ICD-10-CM | POA: Diagnosis not present

## 2021-12-05 DIAGNOSIS — R350 Frequency of micturition: Secondary | ICD-10-CM | POA: Diagnosis not present

## 2021-12-05 DIAGNOSIS — J069 Acute upper respiratory infection, unspecified: Secondary | ICD-10-CM | POA: Diagnosis not present

## 2022-01-13 DIAGNOSIS — L820 Inflamed seborrheic keratosis: Secondary | ICD-10-CM | POA: Diagnosis not present

## 2022-01-13 DIAGNOSIS — D485 Neoplasm of uncertain behavior of skin: Secondary | ICD-10-CM | POA: Diagnosis not present

## 2022-01-13 DIAGNOSIS — Z85828 Personal history of other malignant neoplasm of skin: Secondary | ICD-10-CM | POA: Diagnosis not present

## 2022-02-17 DIAGNOSIS — J019 Acute sinusitis, unspecified: Secondary | ICD-10-CM | POA: Diagnosis not present

## 2022-03-06 ENCOUNTER — Ambulatory Visit
Admission: RE | Admit: 2022-03-06 | Discharge: 2022-03-06 | Disposition: A | Discharge status: Home / Self Care | Payer: Medicare HMO | Source: Ambulatory Visit | Attending: Physician Assistant | Admitting: Physician Assistant

## 2022-03-06 ENCOUNTER — Other Ambulatory Visit: Payer: Self-pay | Admitting: Physician Assistant

## 2022-03-06 DIAGNOSIS — R059 Cough, unspecified: Secondary | ICD-10-CM | POA: Diagnosis not present

## 2022-03-06 DIAGNOSIS — R0981 Nasal congestion: Secondary | ICD-10-CM | POA: Diagnosis not present

## 2022-03-29 ENCOUNTER — Other Ambulatory Visit: Payer: Self-pay | Admitting: Physician Assistant

## 2022-03-29 ENCOUNTER — Ambulatory Visit
Admission: RE | Admit: 2022-03-29 | Discharge: 2022-03-29 | Disposition: A | Discharge status: Home / Self Care | Payer: Medicare HMO | Source: Ambulatory Visit | Attending: Physician Assistant | Admitting: Physician Assistant

## 2022-03-29 DIAGNOSIS — R059 Cough, unspecified: Secondary | ICD-10-CM | POA: Diagnosis not present

## 2022-03-29 DIAGNOSIS — K219 Gastro-esophageal reflux disease without esophagitis: Secondary | ICD-10-CM | POA: Diagnosis not present

## 2022-03-29 DIAGNOSIS — J328 Other chronic sinusitis: Secondary | ICD-10-CM

## 2022-03-29 DIAGNOSIS — M8580 Other specified disorders of bone density and structure, unspecified site: Secondary | ICD-10-CM

## 2022-03-29 DIAGNOSIS — N3281 Overactive bladder: Secondary | ICD-10-CM | POA: Diagnosis not present

## 2022-03-29 DIAGNOSIS — Z0000 Encounter for general adult medical examination without abnormal findings: Secondary | ICD-10-CM | POA: Diagnosis not present

## 2022-03-29 DIAGNOSIS — E785 Hyperlipidemia, unspecified: Secondary | ICD-10-CM | POA: Diagnosis not present

## 2022-03-29 DIAGNOSIS — J329 Chronic sinusitis, unspecified: Secondary | ICD-10-CM | POA: Diagnosis not present

## 2022-04-12 ENCOUNTER — Other Ambulatory Visit: Payer: Self-pay | Admitting: Physician Assistant

## 2022-04-12 DIAGNOSIS — Z1231 Encounter for screening mammogram for malignant neoplasm of breast: Secondary | ICD-10-CM

## 2022-04-13 ENCOUNTER — Encounter: Payer: Self-pay | Admitting: Family Medicine

## 2022-04-14 DIAGNOSIS — H35363 Drusen (degenerative) of macula, bilateral: Secondary | ICD-10-CM | POA: Diagnosis not present

## 2022-04-14 DIAGNOSIS — H25813 Combined forms of age-related cataract, bilateral: Secondary | ICD-10-CM | POA: Diagnosis not present

## 2022-05-03 ENCOUNTER — Ambulatory Visit
Admission: RE | Admit: 2022-05-03 | Discharge: 2022-05-03 | Disposition: A | Discharge status: Home / Self Care | Payer: Medicare HMO | Source: Ambulatory Visit | Attending: Physician Assistant | Admitting: Physician Assistant

## 2022-05-03 DIAGNOSIS — J329 Chronic sinusitis, unspecified: Secondary | ICD-10-CM

## 2022-05-03 DIAGNOSIS — Z85820 Personal history of malignant melanoma of skin: Secondary | ICD-10-CM | POA: Diagnosis not present

## 2022-05-03 DIAGNOSIS — R0981 Nasal congestion: Secondary | ICD-10-CM | POA: Diagnosis not present

## 2022-06-26 DIAGNOSIS — J3489 Other specified disorders of nose and nasal sinuses: Secondary | ICD-10-CM | POA: Diagnosis not present

## 2022-06-26 DIAGNOSIS — J309 Allergic rhinitis, unspecified: Secondary | ICD-10-CM | POA: Diagnosis not present

## 2022-06-26 DIAGNOSIS — J0101 Acute recurrent maxillary sinusitis: Secondary | ICD-10-CM | POA: Diagnosis not present

## 2022-06-27 DIAGNOSIS — D485 Neoplasm of uncertain behavior of skin: Secondary | ICD-10-CM | POA: Diagnosis not present

## 2022-06-27 DIAGNOSIS — L820 Inflamed seborrheic keratosis: Secondary | ICD-10-CM | POA: Diagnosis not present

## 2022-06-27 DIAGNOSIS — Z85828 Personal history of other malignant neoplasm of skin: Secondary | ICD-10-CM | POA: Diagnosis not present

## 2022-06-30 DIAGNOSIS — E785 Hyperlipidemia, unspecified: Secondary | ICD-10-CM | POA: Diagnosis not present

## 2022-08-28 ENCOUNTER — Ambulatory Visit: Payer: Medicare HMO

## 2022-09-13 ENCOUNTER — Ambulatory Visit
Admission: RE | Admit: 2022-09-13 | Discharge: 2022-09-13 | Disposition: A | Discharge status: Home / Self Care | Payer: Medicare HMO | Source: Ambulatory Visit | Attending: Physician Assistant | Admitting: Physician Assistant

## 2022-09-13 DIAGNOSIS — Z1231 Encounter for screening mammogram for malignant neoplasm of breast: Secondary | ICD-10-CM

## 2022-09-15 ENCOUNTER — Other Ambulatory Visit: Payer: Self-pay | Admitting: Physician Assistant

## 2022-09-15 DIAGNOSIS — R928 Other abnormal and inconclusive findings on diagnostic imaging of breast: Secondary | ICD-10-CM

## 2022-09-27 ENCOUNTER — Other Ambulatory Visit: Payer: Self-pay | Admitting: Physician Assistant

## 2022-09-27 ENCOUNTER — Ambulatory Visit
Admission: RE | Admit: 2022-09-27 | Discharge: 2022-09-27 | Disposition: A | Discharge status: Home / Self Care | Payer: Medicare HMO | Source: Ambulatory Visit | Attending: Physician Assistant | Admitting: Physician Assistant

## 2022-09-27 DIAGNOSIS — R928 Other abnormal and inconclusive findings on diagnostic imaging of breast: Secondary | ICD-10-CM

## 2022-09-27 DIAGNOSIS — R921 Mammographic calcification found on diagnostic imaging of breast: Secondary | ICD-10-CM

## 2022-10-03 ENCOUNTER — Ambulatory Visit
Admission: RE | Admit: 2022-10-03 | Discharge: 2022-10-03 | Disposition: A | Discharge status: Home / Self Care | Payer: Medicare HMO | Source: Ambulatory Visit | Attending: Physician Assistant | Admitting: Physician Assistant

## 2022-10-03 DIAGNOSIS — R921 Mammographic calcification found on diagnostic imaging of breast: Secondary | ICD-10-CM

## 2022-10-03 DIAGNOSIS — R928 Other abnormal and inconclusive findings on diagnostic imaging of breast: Secondary | ICD-10-CM

## 2022-10-03 DIAGNOSIS — N6011 Diffuse cystic mastopathy of right breast: Secondary | ICD-10-CM | POA: Diagnosis not present

## 2022-10-03 HISTORY — PX: BREAST BIOPSY: SHX20

## 2022-10-04 LAB — SURGICAL PATHOLOGY

## 2022-10-06 DIAGNOSIS — J329 Chronic sinusitis, unspecified: Secondary | ICD-10-CM | POA: Diagnosis not present

## 2022-10-14 IMAGING — MG MM DIGITAL SCREENING BILAT W/ TOMO AND CAD
8 series · 9 of 24 positions shown · non-contrast
Comparison: Previous exam(s).

CLINICAL DATA: Screening.

EXAM:
DIGITAL SCREENING BILATERAL MAMMOGRAM WITH TOMOSYNTHESIS AND CAD
TECHNIQUE: Bilateral screening digital craniocaudal and mediolateral oblique
mammograms were obtained. Bilateral screening digital breast
tomosynthesis was performed. The images were evaluated with
computer-aided detection.

[R CC synth-2D]
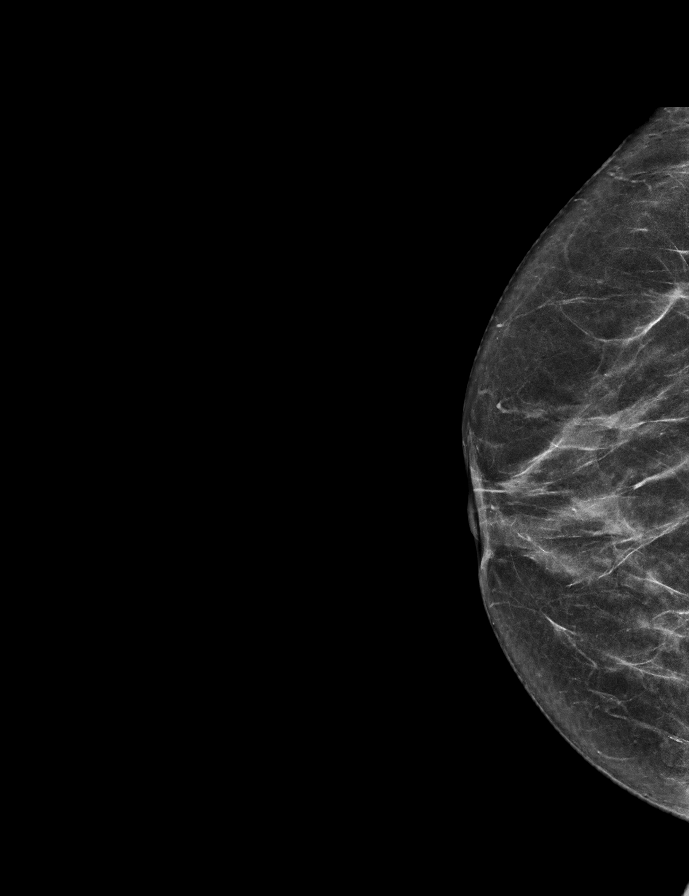

[L CC synth-2D]
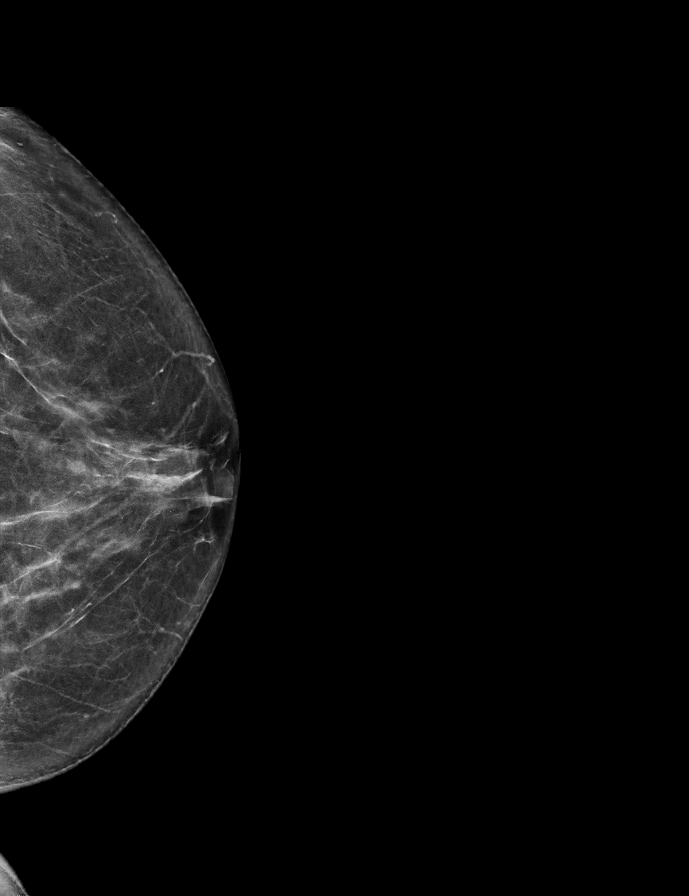

[R MLO synth-2D]
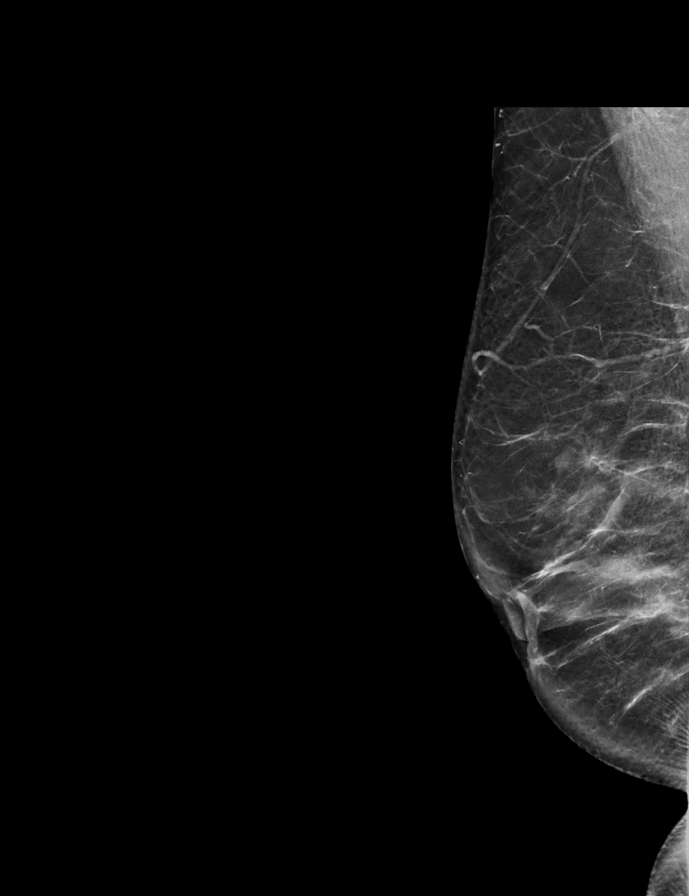

[L MLO synth-2D]
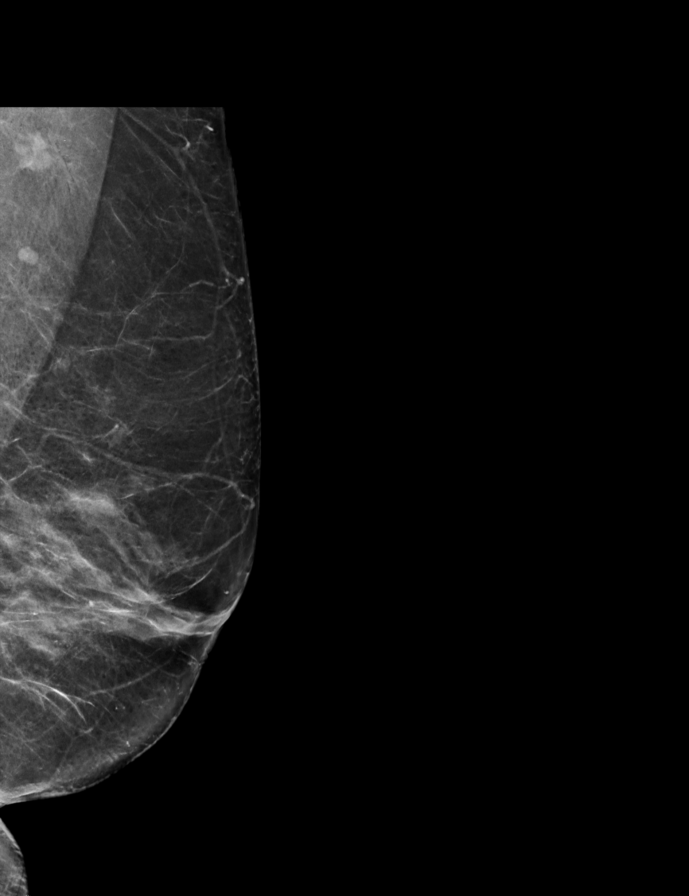

[L MLO tomo · 2 of 68 frames shown]
[frame 22/68]
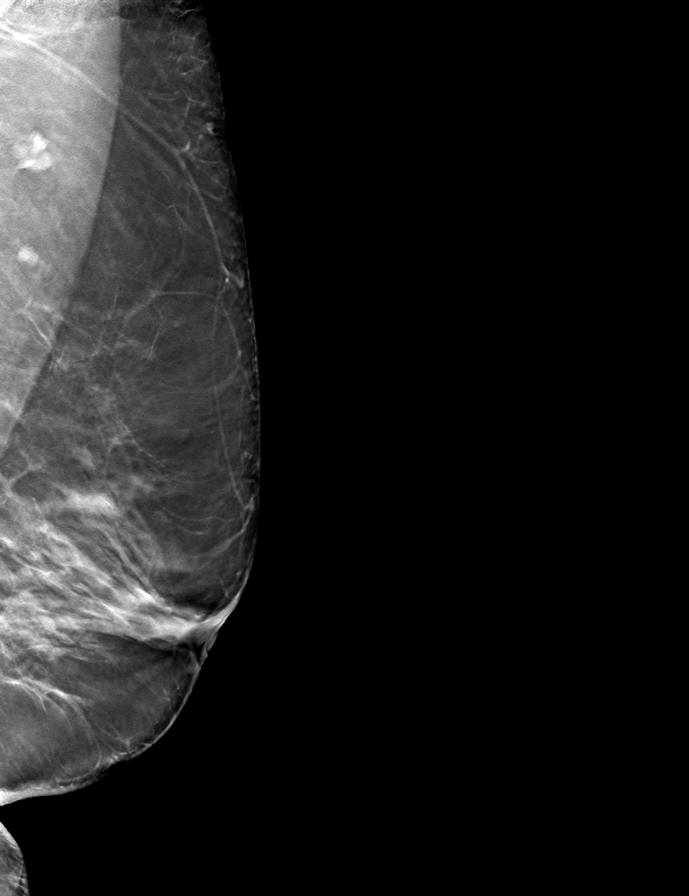
[frame 35/68]
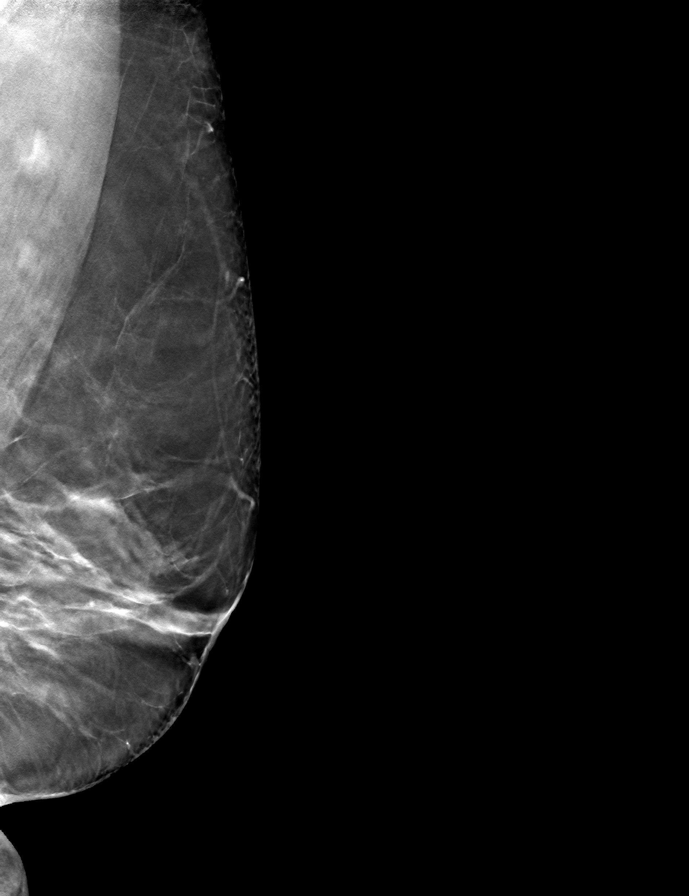

[L CC tomo · tomo slice 33/66.0]
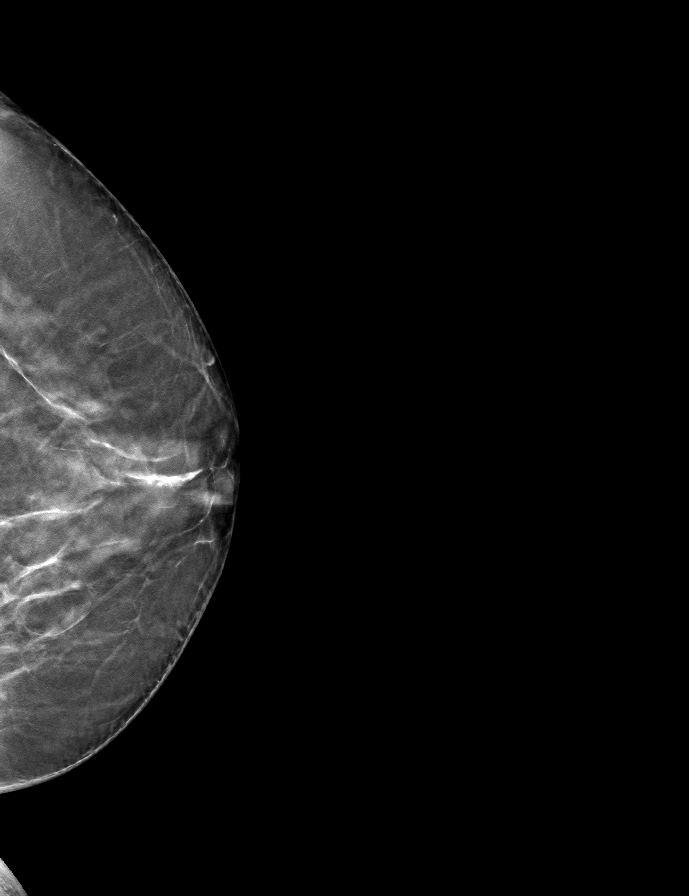

[R CC tomo · tomo slice 31/61.0]
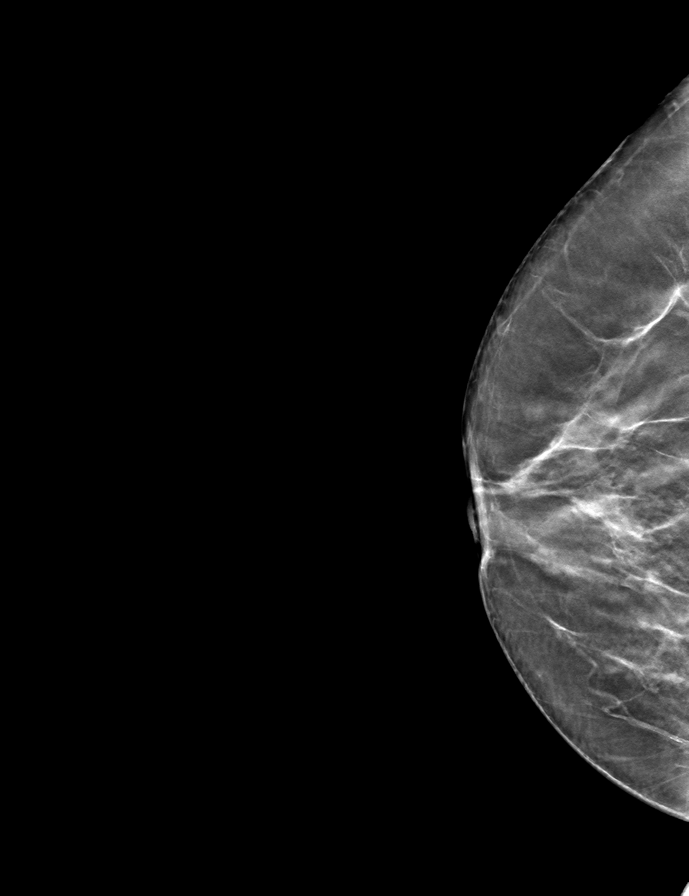

[R MLO tomo · tomo slice 35/68.0]
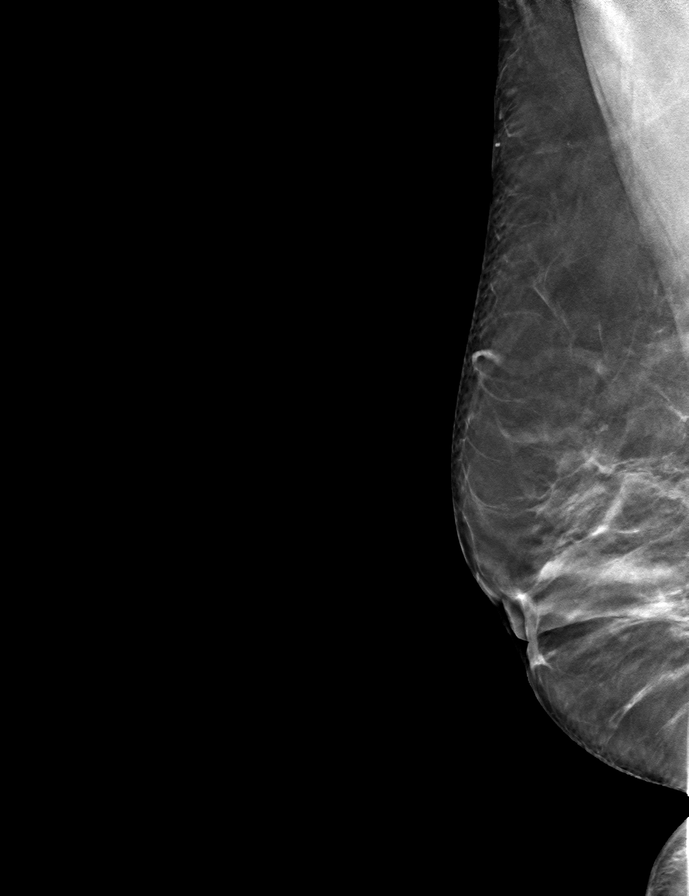

[9 of 24 positions shown; findings below may reference images not displayed]

ACR Breast Density Category b: There are scattered areas of
fibroglandular density.
FINDINGS: There are no findings suspicious for malignancy.
IMPRESSION: No mammographic evidence of malignancy. A result letter of this
screening mammogram will be mailed directly to the patient.

RECOMMENDATION:
Screening mammogram in one year. (Code:51-O-LD2)

BI-RADS CATEGORY  1: Negative.

## 2022-10-16 DIAGNOSIS — H2513 Age-related nuclear cataract, bilateral: Secondary | ICD-10-CM | POA: Diagnosis not present

## 2022-10-19 ENCOUNTER — Ambulatory Visit
Admission: RE | Admit: 2022-10-19 | Discharge: 2022-10-19 | Disposition: A | Discharge status: Home / Self Care | Payer: Medicare HMO | Source: Ambulatory Visit | Attending: Physician Assistant | Admitting: Physician Assistant

## 2022-10-19 DIAGNOSIS — N958 Other specified menopausal and perimenopausal disorders: Secondary | ICD-10-CM | POA: Diagnosis not present

## 2022-10-19 DIAGNOSIS — Z23 Encounter for immunization: Secondary | ICD-10-CM | POA: Diagnosis not present

## 2022-10-19 DIAGNOSIS — M8588 Other specified disorders of bone density and structure, other site: Secondary | ICD-10-CM | POA: Diagnosis not present

## 2022-10-19 DIAGNOSIS — E2839 Other primary ovarian failure: Secondary | ICD-10-CM | POA: Diagnosis not present

## 2022-10-19 DIAGNOSIS — M8580 Other specified disorders of bone density and structure, unspecified site: Secondary | ICD-10-CM

## 2022-10-30 DIAGNOSIS — M810 Age-related osteoporosis without current pathological fracture: Secondary | ICD-10-CM | POA: Diagnosis not present

## 2022-12-04 DIAGNOSIS — H524 Presbyopia: Secondary | ICD-10-CM | POA: Diagnosis not present

## 2023-01-25 ENCOUNTER — Other Ambulatory Visit: Payer: Self-pay | Admitting: Physician Assistant

## 2023-01-25 DIAGNOSIS — R921 Mammographic calcification found on diagnostic imaging of breast: Secondary | ICD-10-CM

## 2023-01-25 DIAGNOSIS — N6011 Diffuse cystic mastopathy of right breast: Secondary | ICD-10-CM

## 2023-04-04 ENCOUNTER — Ambulatory Visit
Admission: RE | Admit: 2023-04-04 | Discharge: 2023-04-04 | Disposition: A | Discharge status: Home / Self Care | Payer: Medicare HMO | Source: Ambulatory Visit | Attending: Physician Assistant | Admitting: Physician Assistant

## 2023-04-04 ENCOUNTER — Other Ambulatory Visit: Payer: Self-pay | Admitting: Physician Assistant

## 2023-04-04 DIAGNOSIS — R921 Mammographic calcification found on diagnostic imaging of breast: Secondary | ICD-10-CM

## 2023-04-04 DIAGNOSIS — N6011 Diffuse cystic mastopathy of right breast: Secondary | ICD-10-CM

## 2023-06-19 ENCOUNTER — Encounter: Payer: Self-pay | Admitting: Gastroenterology

## 2023-08-07 ENCOUNTER — Encounter: Payer: Self-pay | Admitting: Gastroenterology

## 2023-08-07 ENCOUNTER — Ambulatory Visit (AMBULATORY_SURGERY_CENTER): Payer: Self-pay

## 2023-08-07 VITALS — Ht 62.0 in | Wt 114.0 lb

## 2023-08-07 DIAGNOSIS — Z860100 Personal history of colon polyps, unspecified: Secondary | ICD-10-CM

## 2023-08-07 MED ORDER — NA SULFATE-K SULFATE-MG SULF 17.5-3.13-1.6 GM/177ML PO SOLN
1.0000 | Freq: Once | ORAL | 0 refills | Status: AC
Start: 2023-08-07 — End: 2023-08-07

## 2023-08-07 NOTE — Progress Notes (Signed)
 No egg or soy allergy known to patient  No issues known to pt with past sedation with any surgeries or procedures Patient denies ever being told they had issues or difficulty with intubation  No FH of Malignant Hyperthermia Pt is not on diet pills nor GLP-1 medications Pt is not on  home 02  Pt is not on blood thinners  Pt denies issues with chronic constipation  No A fib or A flutter Have any cardiac testing pending--no Pt instructed to use Singlecare.[email or web address] or GoodRx for a price reduction on prep  Ambulates independently

## 2023-08-21 ENCOUNTER — Encounter: Payer: Self-pay | Admitting: Gastroenterology

## 2023-08-21 ENCOUNTER — Ambulatory Visit (AMBULATORY_SURGERY_CENTER): Admitting: Gastroenterology

## 2023-08-21 VITALS — BP 113/55 | HR 60 | Temp 97.9°F | Resp 17 | Ht 62.0 in | Wt 114.0 lb

## 2023-08-21 DIAGNOSIS — Z860100 Personal history of colon polyps, unspecified: Secondary | ICD-10-CM

## 2023-08-21 DIAGNOSIS — K648 Other hemorrhoids: Secondary | ICD-10-CM

## 2023-08-21 DIAGNOSIS — Z1211 Encounter for screening for malignant neoplasm of colon: Secondary | ICD-10-CM

## 2023-08-21 DIAGNOSIS — K6389 Other specified diseases of intestine: Secondary | ICD-10-CM

## 2023-08-21 DIAGNOSIS — Z860101 Personal history of adenomatous and serrated colon polyps: Secondary | ICD-10-CM

## 2023-08-21 DIAGNOSIS — D123 Benign neoplasm of transverse colon: Secondary | ICD-10-CM

## 2023-08-21 DIAGNOSIS — K5730 Diverticulosis of large intestine without perforation or abscess without bleeding: Secondary | ICD-10-CM | POA: Diagnosis not present

## 2023-08-21 MED ORDER — SODIUM CHLORIDE 0.9 % IV SOLN: 500.0000 mL | Freq: Once | INTRAVENOUS | Status: DC

## 2023-08-21 NOTE — Progress Notes (Signed)
 Pt's states no medical or surgical changes since previsit or office visit.

## 2023-08-21 NOTE — Op Note (Signed)
 [facility] Patient Name: Brianna Cruz Procedure Date: 08/21/2023 9:44 AM MRN: [id number] Endoscopist: Elspeth P. Leigh , MD, [id number] Age: 73 Referring MD:  Date of Birth: 12-Nov-1950 Gender: Female Account #: [id number] Procedure:                Colonoscopy Indications:              High risk colon cancer surveillance: Personal                            history of colonic polyps - adenoma removed 12/2015 Medicines:                Monitored Anesthesia Care Procedure:                Pre-Anesthesia Assessment:                           - Prior to the procedure, a History and Physical                            was performed, and patient medications and                            allergies were reviewed. The patient's tolerance of                            previous anesthesia was also reviewed. The risks                            and benefits of the procedure and the sedation                            options and risks were discussed with the patient.                            All questions were answered, and informed consent                            was obtained. Prior Anticoagulants: The patient has                            taken no anticoagulant or antiplatelet agents. ASA                            Grade Assessment: II - A patient with mild systemic                            disease. After reviewing the risks and benefits,                            the patient was deemed in satisfactory condition to                            undergo the procedure.  After obtaining informed consent, the colonoscope                            was passed under direct vision. Throughout the                            procedure, the patient's blood pressure, pulse, and                            oxygen saturations were monitored continuously. The                            PCF-H190TL Slim [id number] was introduced through                            the anus  and advanced to the the cecum, identified                            by appendiceal orifice and ileocecal valve. The                            colonoscopy was performed without difficulty. The                            patient tolerated the procedure well. The quality                            of the bowel preparation was adequate. The                            ileocecal valve, appendiceal orifice, and rectum                            were photographed. Scope In: 9:56:43 AM Scope Out: 10:15:09 AM Scope Withdrawal Time: 0 hours 11 minutes 8 seconds  Total Procedure Duration: 0 hours 18 minutes 26 seconds  Findings:                 The perianal and digital rectal examinations were                            normal.                           Multiple small-mouthed diverticula were found in                            the left colon and right colon.                           A 3 mm polyp was found in the transverse colon. The                            polyp was flat. The polyp was removed with a cold  snare. Resection and retrieval were complete.                           Internal hemorrhoids were found during                            retroflexion. The hemorrhoids were small.                           There was restricted mobility of the left colon -                            ultraslim pediatric colonoscope used to perform                            this exam which worked well to navigate the left                            colon. The exam was otherwise without abnormality. Complications:            No immediate complications. Estimated blood loss:                            Minimal. Estimated Blood Loss:     Estimated blood loss was minimal. Impression:               - Diverticulosis in the left colon and in the right                            colon. Ultraslim pediatric colonoscope used to                            traverse it.                            - One 3 mm polyp in the transverse colon, removed                            with a cold snare. Resected and retrieved.                           - Internal hemorrhoids.                           - The examination was otherwise normal. Recommendation:           - Patient has a contact number available for                            emergencies. The signs and symptoms of potential                            delayed complications were discussed with the                            patient. Return to  normal activities tomorrow.                            Written discharge instructions were provided to the                            patient.                           - Resume previous diet.                           - Continue present medications.                           - Await pathology results. Elspeth P. Leigh, MD 08/21/2023 10:23:38 AM This report has been signed electronically.

## 2023-08-21 NOTE — Progress Notes (Signed)
 A/o x 3, VSS, gd SR's, pleased with anesthesia, report to RN

## 2023-08-21 NOTE — Progress Notes (Signed)
 [facility] History and Physical   Primary Care Physician:  Wonda Worth SQUIBB, PA   Reason for Procedure:   History of colon polyps  Plan:    colonoscopy     HPI: Brianna Cruz is a 73 y.o. female  here for colonoscopy surveillance - last exam 12/2015 - one adenoma removed.   Patient denies any bowel symptoms at this time. Otherwise feels well without any cardiopulmonary symptoms.   I have discussed risks / benefits of anesthesia and endoscopic procedure with Levorn Joeann Alstrom and they wish to proceed with the exams as outlined today.    Past Medical History:  Diagnosis Date   Allergy    Arthritis    hands   Cancer (HCC)    melanoma and basal cell 03-2003   Hyperlipidemia    Osteopenia     Past Surgical History:  Procedure Laterality Date   ABDOMINAL HYSTERECTOMY  12/14/2013   BREAST BIOPSY  03/30/1998   BREAST BIOPSY Right 10/03/2022   MM RT BREAST BX W LOC DEV 1ST LESION IMAGE BX SPEC STEREO GUIDE 10/03/2022 [facility]   COLONOSCOPY  2007   [facility]- normal per pt.   GASTROSTOMY     MELANOMA EXCISION  03/23/2003   MOHS SURGERY  11/17/2014   OOPHORECTOMY Left 07/09/2010    Prior to Admission medications   Medication Sig Start Date End Date Taking? Authorizing Provider  calcium citrate (CALCITRATE - DOSED IN MG ELEMENTAL CALCIUM) 950 (200 Ca) MG tablet Take 200 mg of elemental calcium by mouth daily.   Yes [provider]  risedronate (ACTONEL) 35 MG tablet Take 35 mg by mouth every 7 (seven) days. with water on empty stomach, nothing by mouth or lie down for next 30 minutes.   Yes [provider]  Ascorbic Acid (VITAMIN C) 1000 MG tablet Take 1,000 mg by mouth. Patient not taking: Reported on 08/07/2023    [provider]  aspirin EC 81 MG tablet Take 81 mg by mouth. Patient not taking: Reported on 08/07/2023 05/04/09   [provider]  Calcium Carb-Cholecalciferol 600-800 MG-UNIT TABS Take by mouth. Patient not  taking: Reported on 08/07/2023 05/04/09   [provider]  Multiple Vitamins-Minerals (COMPLETE) TABS Take by mouth. Patient not taking: Reported on 08/07/2023 05/04/09   [provider]    Current Outpatient Medications  Medication Sig Dispense Refill   calcium citrate (CALCITRATE - DOSED IN MG ELEMENTAL CALCIUM) 950 (200 Ca) MG tablet Take 200 mg of elemental calcium by mouth daily.     risedronate (ACTONEL) 35 MG tablet Take 35 mg by mouth every 7 (seven) days. with water on empty stomach, nothing by mouth or lie down for next 30 minutes.     Ascorbic Acid (VITAMIN C) 1000 MG tablet Take 1,000 mg by mouth. (Patient not taking: Reported on 08/07/2023)     aspirin EC 81 MG tablet Take 81 mg by mouth. (Patient not taking: Reported on 08/07/2023)     Calcium Carb-Cholecalciferol 600-800 MG-UNIT TABS Take by mouth. (Patient not taking: Reported on 08/07/2023)     Multiple Vitamins-Minerals (COMPLETE) TABS Take by mouth. (Patient not taking: Reported on 08/07/2023)     Current Facility-Administered Medications  Medication Dose Route Frequency Provider Last Rate Last Admin   0.9 %  sodium chloride  infusion  500 mL Intravenous Continuous Santino Kinsella, Elspeth SQUIBB, MD       0.9 %  sodium chloride  infusion  500 mL Intravenous Once Porcia Morganti, Elspeth SQUIBB, MD  Allergies as of 08/21/2023 - Review Complete 08/21/2023  Allergen Reaction Noted   Alendronate sodium Other (See Comments) 08/07/2023   Latex Other (See Comments) 12/04/2013    Family History  Problem Relation Age of Onset   Ovarian cancer Sister        unknown age   Diabetes Brother    Cancer Maternal Aunt        unknown cancer   Colon cancer Maternal Grandmother    Breast cancer Cousin        unknown age   Colon polyps Neg Hx    Esophageal cancer Neg Hx    Rectal cancer Neg Hx    Stomach cancer Neg Hx     Social History   Socioeconomic History   Marital status: Unknown    Spouse name: Not on file   Number of children:  Not on file   Years of education: Not on file   Highest education level: Not on file  Occupational History   Not on file  Tobacco Use   Smoking status: Never   Smokeless tobacco: Never  Vaping Use   Vaping status: Never Used  Substance and Sexual Activity   Alcohol use: No   Drug use: No   Sexual activity: Not on file  Other Topics Concern   Not on file  Social History Narrative   Not on file   Social Drivers of Health   Financial Resource Strain: Not on file  Food Insecurity: Low Risk  (06/26/2022)   Received from [facility]   Hunger Vital Sign    Within the past 12 months, you worried that your food would run out before you got money to buy more: Never true    Within the past 12 months, the food you bought just didn't last and you didn't have money to get more. : Never true  Transportation Needs: No Transportation Needs (06/26/2022)   Received from [facility]    In the past 12 months, has lack of reliable transportation kept you from medical appointments, meetings, work or from getting things needed for daily living? : No  Physical Activity: Not on file  Stress: Not on file  Social Connections: Not on file  Intimate Partner Violence: Not on file    Review of Systems: All other review of systems negative except as mentioned in the HPI.  Physical Exam: Vital signs BP 129/75   Pulse 75   Temp 97.9 F (36.6 C) (Temporal)   Ht 5' 2 (1.575 m)   Wt 114 lb (51.7 kg)   SpO2 99%   BMI 20.85 kg/m   General:   Alert,  Well-developed, pleasant and cooperative in NAD Lungs:  Clear throughout to auscultation.   Heart:  Regular rate and rhythm Abdomen:  Soft, nontender and nondistended.   Neuro/Psych:  Alert and cooperative. Normal mood and affect. A and O x 3  Marcey Naval, MD [facility]

## 2023-08-21 NOTE — Progress Notes (Signed)
 Called to room to assist during endoscopic procedure.  Patient ID and intended procedure confirmed with present staff. Received instructions for my participation in the procedure from the performing physician.

## 2023-08-21 NOTE — Patient Instructions (Signed)
 Resume previous diet and medications. Awaiting pathology results. Repeat Colonoscopy date to be determined based on pathology results. Handouts provided on Colon polyps, Diverticulosis and Hemorrhoids   YOU HAD AN ENDOSCOPIC PROCEDURE TODAY AT THE [facility]:   Refer to the procedure report that was given to you for any specific questions about what was found during the examination.  If the procedure report does not answer your questions, please call your gastroenterologist to clarify.  If you requested that your care partner not be given the details of your procedure findings, then the procedure report has been included in a sealed envelope for you to review at your convenience later.  YOU SHOULD EXPECT: Some feelings of bloating in the abdomen. Passage of more gas than usual.  Walking can help get rid of the air that was put into your GI tract during the procedure and reduce the bloating. If you had a lower endoscopy (such as a colonoscopy or flexible sigmoidoscopy) you may notice spotting of blood in your stool or on the toilet paper. If you underwent a bowel prep for your procedure, you may not have a normal bowel movement for a few days.  Please Note:  You might notice some irritation and congestion in your nose or some drainage.  This is from the oxygen used during your procedure.  There is no need for concern and it should clear up in a day or so.  SYMPTOMS TO REPORT IMMEDIATELY:  Following lower endoscopy (colonoscopy or flexible sigmoidoscopy):  Excessive amounts of blood in the stool  Significant tenderness or worsening of abdominal pains  Swelling of the abdomen that is new, acute  Fever of 100F or higher  For urgent or emergent issues, a gastroenterologist can be reached at any hour by calling [phone]. Do not use MyChart messaging for urgent concerns.    DIET:  We do recommend a small meal at first, but then you may proceed to your regular diet.  Drink plenty of  fluids but you should avoid alcoholic beverages for 24 hours.  ACTIVITY:  You should plan to take it easy for the rest of today and you should NOT DRIVE or use heavy machinery until tomorrow (because of the sedation medicines used during the test).    FOLLOW UP: Our staff will call the number listed on your records the next business day following your procedure.  We will call around 7:15- 8:00 am to check on you and address any questions or concerns that you may have regarding the information given to you following your procedure. If we do not reach you, we will leave a message.     If any biopsies were taken you will be contacted by phone or by letter within the next 1-3 weeks.  Please call us at [phone] if you have not heard about the biopsies in 3 weeks.    SIGNATURES/CONFIDENTIALITY: You and/or your care partner have signed paperwork which will be entered into your electronic medical record.  These signatures attest to the fact that that the information above on your After Visit Summary has been reviewed and is understood.  Full responsibility of the confidentiality of this discharge information lies with you and/or your care-partner.

## 2023-08-22 ENCOUNTER — Telehealth: Payer: Self-pay

## 2023-08-22 NOTE — Telephone Encounter (Signed)
  Follow up Call-     08/21/2023    8:55 AM  Call back number  Post procedure Call Back phone  # [phone]  Permission to leave phone message Yes     Patient questions:  Do you have a fever, pain , or abdominal swelling? No. Pain Score  0 *  Have you tolerated food without any problems? Yes.    Have you been able to return to your normal activities? Yes.    Do you have any questions about your discharge instructions: Diet   No. Medications  No. Follow up visit  No.  Do you have questions or concerns about your Care? No.  Actions: * If pain score is 4 or above: No action needed, pain <4.

## 2023-08-23 ENCOUNTER — Ambulatory Visit: Payer: Self-pay | Admitting: Gastroenterology

## 2023-08-23 LAB — SURGICAL PATHOLOGY

## 2023-10-04 ENCOUNTER — Encounter

## 2023-10-04 ENCOUNTER — Ambulatory Visit
Admission: RE | Admit: 2023-10-04 | Discharge: 2023-10-04 | Disposition: A | Source: Ambulatory Visit | Attending: Physician Assistant | Admitting: Physician Assistant

## 2023-10-04 DIAGNOSIS — R921 Mammographic calcification found on diagnostic imaging of breast: Secondary | ICD-10-CM

## 2023-10-04 DIAGNOSIS — N6011 Diffuse cystic mastopathy of right breast: Secondary | ICD-10-CM
